# Patient Record
Sex: Female | Born: 1990 | Race: White | Hispanic: No | Marital: Single | Smoking: Never smoker
Health system: Southern US, Community
[De-identification: ages and names within clinical notes are randomized; demographics above are authoritative.]

## PROBLEM LIST (undated history)

## (undated) DIAGNOSIS — F419 Anxiety disorder, unspecified: Secondary | ICD-10-CM

---

## 2014-02-12 ENCOUNTER — Ambulatory Visit (INDEPENDENT_AMBULATORY_CARE_PROVIDER_SITE_OTHER): Payer: BC Managed Care – PPO | Admitting: Family Medicine

## 2014-02-12 VITALS — BP 120/80 | HR 100 | Temp 98.0°F | Resp 16 | Ht 61.25 in | Wt 128.0 lb

## 2014-02-12 DIAGNOSIS — R0602 Shortness of breath: Secondary | ICD-10-CM

## 2014-02-12 DIAGNOSIS — F411 Generalized anxiety disorder: Secondary | ICD-10-CM

## 2014-02-12 MED ORDER — ALBUTEROL SULFATE HFA 108 (90 BASE) MCG/ACT IN AERS: INHALATION_SPRAY | RESPIRATORY_TRACT | Status: DC

## 2014-02-12 MED ORDER — ALBUTEROL SULFATE (2.5 MG/3ML) 0.083% IN NEBU
2.5000 mg | INHALATION_SOLUTION | Freq: Once | RESPIRATORY_TRACT | Status: AC
  Administered 2014-02-12: 2.5 mg via RESPIRATORY_TRACT

## 2014-02-12 MED ORDER — LORAZEPAM 0.5 MG PO TABS: ORAL_TABLET | ORAL | Status: DC

## 2014-02-12 NOTE — Progress Notes (Signed)
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 She has been having problems feeling like she cannot get a deep breath the last few days. She says she's been under a lot of stress and realizes that may be adding to things. She denies headaches. Has had maybe some mild dizziness, fairly nonspecific. No ear pain says her thoughts been a little bit sore from yawning so much because she is trying to get her breath. She has not been having any chest pains or palpitations, though she has a history of having some irregularities in her heart. She apparently has had tests in the past and everything is come back normal. Her mother was a little anxious about that when she spoke to her on the phone. She has not had any GI or GU complaints. She is currently on her menstrual cycle. 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 She has a boyfriend who is here with her today. She is not sexually involved. She does not have any major outlets of things that she does for just relaxation. She says she is involved in her church, [employer].  Objective: No acute distress. Alert and oriented. A little anxious. TMs normal. Throat clear. Neck supple without nodes. Chest clear. Heart regular without murmurs. Actually she does have a little bit of a sinus arrhythmia and a listen to her heart for a long stretch in there were no ectopics.. Abdomen soft without mass or tenderness.  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 She is anxious, and I think that is probably her main thing, but I cannot fully explain the low peak flow.  The posttreatment peak flow improved to 370. She has a hard time forcing herself to exhale rapidly however. It seems to be more a use of the device than  true bronchospasm. However improved a little bit we will try the bronchodilator and something for anxiety. Return if in all worse

## 2014-02-12 NOTE — Patient Instructions (Signed)
Use the albuterol inhaler about 4 times daily 2 inhalations each time as directed if needed for the sensation of shortness of breath  Take lorazepam 0.5 mg twice daily when needed for anxiety  Return if not improving  Go to the emergency room if acute breathing difficulties.

## 2014-05-16 ENCOUNTER — Encounter: Payer: Self-pay | Admitting: *Deleted

## 2014-05-16 ENCOUNTER — Ambulatory Visit (INDEPENDENT_AMBULATORY_CARE_PROVIDER_SITE_OTHER): Payer: BLUE CROSS/BLUE SHIELD | Admitting: Cardiovascular Disease

## 2014-05-16 VITALS — BP 94/66 | HR 88 | Ht 61.0 in | Wt 131.8 lb

## 2014-05-16 DIAGNOSIS — R9431 Abnormal electrocardiogram [ECG] [EKG]: Secondary | ICD-10-CM

## 2014-05-16 DIAGNOSIS — R079 Chest pain, unspecified: Secondary | ICD-10-CM

## 2014-05-16 NOTE — Patient Instructions (Signed)
Your physician recommends that you continue on your current medications as directed. Please refer to the Current Medication list given to you today.  Your physician recommends that you schedule a follow-up appointment as needed with Dr. Nahser.  

## 2014-05-16 NOTE — Progress Notes (Signed)
Cardiology Office Note   Date:  05/16/2014   ID:  Elaine Acosta, DOB 1990/07/17, MRN [id number]  PCP:  No PCP Per Patient  Cardiologist:   Vesta Mixer, MD   Chief Complaint  Patient presents with  . Shortness of Breath   Problem List 1. Dyspnea 2. Mitral valve prolapse    History of Present Illness: Elaine Acosta is a 24 y.o. female who presents for evaluation of some chest pain and shortness of breath.  She was seen with her mother today.  She had a stress myoview April, 2015 which was negative for ischemia. 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  She works out regularly  3-4 times a week , no CP or dyspnea . Occasionally feel like she cant take a deep breath.    Has been having the breathing troubles for the past year - worse this month Also has dizziness - MRI of the brain was normal   Non smoker  Non ETOH.     No past medical history on file.  No past surgical history on file.   Current Outpatient Prescriptions  Medication Sig Dispense Refill  . albuterol (PROVENTIL HFA;VENTOLIN HFA) 108 (90 BASE) MCG/ACT inhaler Use 2 inhalations 4 times daily as needed for difficulty breathing 1 Inhaler 1  . LORazepam (ATIVAN) 0.5 MG tablet Take one twice daily if needed for anxiety attack or chest tightness sensation 20 tablet 0   No current facility-administered medications for this visit.    Allergies:   Review of patient's allergies indicates no known allergies.    Social History:  The patient  reports that she has never smoked. She does not have any smokeless tobacco history on file.   Family History:  The patient's family history includes Cancer in her paternal grandmother; High Cholesterol in her father.    ROS:  Please see the history of present illness.    Review of Systems: Constitutional:  denies fever, chills, diaphoresis, appetite change and fatigue.    HEENT: denies photophobia, eye pain, redness, hearing loss, ear pain, congestion, sore throat, rhinorrhea, sneezing, neck pain, neck stiffness and tinnitus.  Respiratory: denies SOB, DOE, cough, chest tightness, and wheezing.  Cardiovascular: denies chest pain, palpitations and leg swelling.  Gastrointestinal: denies nausea, vomiting, abdominal pain, diarrhea, constipation, blood in stool.  Genitourinary: denies dysuria, urgency, frequency, hematuria, flank pain and difficulty urinating.  Musculoskeletal: denies  myalgias, back pain, joint swelling, arthralgias and gait problem.   Skin: denies pallor, rash and wound.  Neurological: denies dizziness, seizures, syncope, weakness, light-headedness, numbness and headaches.   Hematological: denies adenopathy, easy bruising, personal or family bleeding history.  Psychiatric/ Behavioral: denies suicidal ideation, mood changes, confusion, nervousness, sleep disturbance and agitation.       All other systems are reviewed and negative.    PHYSICAL EXAM: VS:  There were no vitals taken for this visit. , BMI There is no weight on file to calculate BMI. GEN: Well nourished, well developed, in no acute distress HEENT: normal Neck: no JVD, carotid bruits, or masses Cardiac: RRR; no significant  Murmur, no  rubs, or gallops,no edema  Respiratory:  clear to auscultation bilaterally, normal work of breathing GI: soft, nontender, nondistended, + BS MS: no deformity or atrophy Skin: warm and dry, no rash Neuro:  Strength and sensation are intact Psych: normal   EKG:  EKG is ordered today. The ekg ordered  today demonstrates NSR at 66. No ST or T wave changes. [place] from [place] revealed TWI in lead V3.    Recent Labs: No results found for requested labs within last 365 days.    Lipid Panel No results found for: CHOL, TRIG, HDL, CHOLHDL, VLDL, LDLCALC, LDLDIRECT    Wt Readings from Last 3 Encounters:  02/12/14 128 lb (58.06 kg)      Other  studies Reviewed: Additional studies/ records that were reviewed today include: . Review of the above records demonstrates:    ASSESSMENT AND PLAN:  1.  Chest pain / dyspnea:  She has had a stress myoview and an echo which were both normal . She had mild myxomatous changes of the mitral valve.  She exercises 3-4 times a week and has never had any problems with her work outs .  She had an abnormal ECG in [place] but her repeat ECG here is normal .   I do not think that she needs any repeat testing.  She has my card and can call me if she has any further problems.     Current medicines are reviewed at length with the patient today.  The patient does not have concerns regarding medicines.  The following changes have been made:  no change   Disposition:   FU with me as needed.     Signed, Zephaniah Enyeart, Deloris PingPhilip J, MD  05/16/2014 6:19 AM    [facility] [address], [place], [place]  [address] Phone: [phone]) [phone]; Fax: [phone]

## 2014-12-31 ENCOUNTER — Ambulatory Visit (INDEPENDENT_AMBULATORY_CARE_PROVIDER_SITE_OTHER): Payer: BLUE CROSS/BLUE SHIELD | Admitting: Physician Assistant

## 2014-12-31 VITALS — BP 122/78 | HR 72 | Temp 97.8°F | Resp 18 | Ht 62.0 in | Wt 136.8 lb

## 2014-12-31 DIAGNOSIS — R0981 Nasal congestion: Secondary | ICD-10-CM

## 2014-12-31 MED ORDER — GUAIFENESIN ER 1200 MG PO TB12: 1.0000 | ORAL_TABLET | Freq: Two times a day (BID) | ORAL | Status: AC

## 2014-12-31 MED ORDER — FLUTICASONE PROPIONATE 50 MCG/ACT NA SUSP: 2.0000 | Freq: Every day | NASAL | Status: DC

## 2014-12-31 NOTE — Progress Notes (Signed)
Elaine Acosta  MRN: [id number] DOB: [birth year]  Subjective:  Pt presents to clinic with about a week of feeling no appetite and SOB.  This is like what she had last year when she was seen in 02/2014.  Last year she was given albuterol and that did not help her SOB rather she felt like it might have made with worse.  The SOB is a sensation that she cannot get a full deep breath but she has had no wheezing and no trouble talking in full sentences.  She has not really eaten in the last week because she states that she is not hungry.  She has been drinking a lot of water - she normally does this but she is drinking more than normal over the last week.  She has had some PND and 2 days ago she had some nasal rhinorrhea but that resolved but not the PND.    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  Used ativan yesterday that she had from last year and that seemed to calm her down and slightly help her breathing.  There are no active problems to display for this patient.   No current outpatient prescriptions on file prior to visit.   No current facility-administered medications on file prior to visit.    No Known Allergies  Review of Systems  Constitutional: Positive for chills, appetite change (decreased) and fatigue. Negative for fever.  HENT: Positive for congestion (intermittent - clear about 3 days ago), ear pain and postnasal drip. Negative for sore throat.   Respiratory: Positive for apnea and shortness of breath. Negative for wheezing.        No h/o asthma, nonsmoker  Gastrointestinal: Negative for nausea, vomiting, abdominal pain, diarrhea and constipation.  Musculoskeletal: Positive for myalgias.  Allergic/Immunologic: Negative for environmental allergies.  Neurological: Negative for  dizziness and headaches.   Objective:  BP 122/78 mmHg  Pulse 72  Temp(Src) 97.8 F (36.6 C) (Oral)  Resp 18  Ht  (1.575 m)  Wt 136 lb 12.8 oz (62.052 kg)  BMI 25.01 kg/m2  SpO2 96%  LMP 12/24/2014  Physical Exam  Constitutional: She is oriented to person, place, and time and well-developed, well-nourished, and in no distress.  Pt drinks a lot of water during her visit - seems to be using it for breaks  HENT:  Head: Normocephalic and atraumatic.  Right Ear: Hearing, tympanic membrane, external ear and ear canal normal.  Left Ear: Hearing, tympanic membrane, external ear and ear canal normal.  Nose: Mucosal edema (red and swollen) present.  Mouth/Throat: Uvula is midline, oropharynx is clear and moist and mucous membranes are normal.  Eyes: Conjunctivae are normal.  Neck: Normal range of motion.  Cardiovascular: Normal rate, regular rhythm and normal heart sounds.   No murmur heard. Pulmonary/Chest: Effort normal and breath sounds normal. She has no wheezes.  Neurological: She is alert and oriented to person, place, and time. Gait normal.  Skin: Skin is warm and dry.  Psychiatric: Mood, memory, affect and judgment normal.  Vitals reviewed.  Orthostatic VS for the past 24 hrs:  BP- Lying Pulse- Lying BP- Sitting Pulse- Sitting BP- Standing at 0 minutes Pulse- Standing at 0 minutes  12/31/14 0904 113/74 mmHg 66 104/69 mmHg 76  112/76 mmHg 88    Assessment and Plan :  Nasal congestion - Plan: Guaifenesin (MUCINEX MAXIMUM STRENGTH) 1200 MG TB12, fluticasone (FLONASE) 50 MCG/ACT nasal spray   No abnormal lung exam - pts breathing appears to be fine in the room - she uses water as a diversion.  We discussed anxiety and its possible role in her symptoms.  We will treat her congestion because I think that has a role in her current symptoms.  We will start eating small meals multiple times a day.  She will use the ativan that she has at home which has helped with her symptoms.  She  knows when to RTC - if this continues she will RTC for CBC and TSH.  Benny Lennert PA-C  [facility] 12/31/2014 9:24 AM

## 2015-01-27 ENCOUNTER — Ambulatory Visit (INDEPENDENT_AMBULATORY_CARE_PROVIDER_SITE_OTHER): Payer: BLUE CROSS/BLUE SHIELD | Admitting: Physician Assistant

## 2015-01-27 VITALS — BP 104/78 | HR 99 | Temp 98.2°F | Resp 16 | Ht 60.5 in | Wt 132.5 lb

## 2015-01-27 DIAGNOSIS — R0602 Shortness of breath: Secondary | ICD-10-CM

## 2015-01-27 LAB — POCT CBC
GRANULOCYTE PERCENT: 66.9 % (ref 37–80)
HEMATOCRIT: 40.8 % (ref 37.7–47.9)
HEMOGLOBIN: 13.8 g/dL (ref 12.2–16.2)
LYMPH, POC: 2.5 (ref 0.6–3.4)
MCH, POC: 29 pg (ref 27–31.2)
MCHC: 33.8 g/dL (ref 31.8–35.4)
MCV: 85.7 fL (ref 80–97)
MID (cbc): 0.6 (ref 0–0.9)
MPV: 8.1 fL (ref 0–99.8)
POC GRANULOCYTE: 6.3 (ref 2–6.9)
POC LYMPH %: 26.7 % (ref 10–50)
POC MID %: 6.4 % (ref 0–12)
Platelet Count, POC: 350 10*3/uL (ref 142–424)
RBC: 4.76 M/uL (ref 4.04–5.48)
RDW, POC: 11.8 %
WBC: 9.4 10*3/uL (ref 4.6–10.2)

## 2015-01-27 MED ORDER — MOMETASONE FURO-FORMOTEROL FUM 100-5 MCG/ACT IN AERO
2.0000 | INHALATION_SPRAY | Freq: Two times a day (BID) | RESPIRATORY_TRACT | Status: DC
Start: 2015-01-27 — End: 2015-03-13

## 2015-01-27 NOTE — Progress Notes (Signed)
Elaine Acosta  MRN: [id number] DOB: 1990/05/03  Subjective:  Pt presents to clinic with continued sensation of SOB and the inability to get a full deep breath without wheezing.  Her nasal congestion has gotten much better and almost resolved but a few days ago she started to get a little chest congestion but it did not seem to affect her SOB.  She does not have a cough.  She does not have symptoms of heartburn and the SOB does not seem to be after she eats or when she lays down.  She is still able to sing but she notices that she is winded afterwards and it takes her longer than normal to recover.  She also notices that walking up steps leaves her more winded.  She has not used her albuterol because the SE are worse.  She has used her Ativan a few times and she thinks that it may help but she is not sure.  She knows she has anxiety but she is really unaware when that is affecting her.  She has no associated symptoms such as palpitation or sweating or chest pain.  No SOB with sleeping or laying down.  There are no active problems to display for this patient.   Current Outpatient Prescriptions on File Prior to Visit  Medication Sig Dispense Refill  . fluticasone (FLONASE) 50 MCG/ACT nasal spray Place 2 sprays into both nostrils daily. 16 g 0   No current facility-administered medications on file prior to visit.    No Known Allergies  Review of Systems  Constitutional: Positive for appetite change (decrease). Negative for fever and chills.  HENT: Negative for congestion and postnasal drip.   Respiratory: Positive for cough and shortness of breath (esp with activity or singing but will get it other times also - feels like she cannot get a full deep  breath vs  being short of breath). Negative for wheezing.   Cardiovascular:       No heartburn or indigestion  Gastrointestinal: Negative for nausea and abdominal pain.  Psychiatric/Behavioral: Negative for sleep disturbance.   Objective:    BP 104/78 mmHg  Pulse 99  Temp(Src) 98.2 F (36.8 C) (Oral)  Resp 16  Ht 5' 0.5" (1.537 m)  Wt 132 lb 8 oz (60.102 kg)  BMI 25.44 kg/m2  SpO2 98%  LMP 01/22/2015  Physical Exam  Constitutional: She is oriented to person, place, and time and well-developed, well-nourished, and in no distress.  HENT:  Head: Normocephalic and atraumatic.  Right Ear: Hearing, tympanic membrane, external ear and ear canal normal.  Left Ear: Hearing, tympanic membrane, external ear and ear canal normal.  Nose: Nose normal.  Mouth/Throat: Uvula is midline, oropharynx is clear and moist and mucous membranes are normal.  Eyes: Conjunctivae are normal.  Neck: Normal range of motion.  Cardiovascular: Normal rate, regular rhythm and normal heart sounds.   No murmur heard. Pulmonary/Chest: Effort normal and breath sounds normal. She has no wheezes.  Neurological: She is alert and oriented to person, place, and time. Gait normal.  Skin: Skin is warm and dry.  Psychiatric: Mood, memory, affect and judgment normal.  Vitals reviewed. . Attempted spirometry but pat was not able to get 3 consistent readings - she stated that she was unable to breath for as long as was needed - she did not feel like she could complete the test.  Results for orders placed or performed in visit on 01/27/15  TSH  Result Value Ref Range  TSH 3.266 0.350 - 4.500 uIU/mL  COMPLETE METABOLIC PANEL WITH GFR  Result Value Ref Range   Sodium 139 135 - 146 mmol/L   Potassium 4.3 3.5 - 5.3 mmol/L   Chloride 104 98 - 110 mmol/L   CO2 26 20 - 31 mmol/L   Glucose, Bld 84 65 - 99 mg/dL   BUN 11 7 - 25 mg/dL   Creat 1.470.70 8.290.50 - 5.621.10 mg/dL   Total Bilirubin 1.5 (H) 0.2 - 1.2 mg/dL   Alkaline Phosphatase 56 33 - 115 U/L   AST 14 10 - 30 U/L   ALT 6 6 - 29 U/L   Total Protein 7.2 6.1 - 8.1 g/dL   Albumin 4.4 3.6 - 5.1 g/dL   Calcium 9.9 8.6 - 13.010.2 mg/dL   GFR, Est African American >89 >=60 mL/min   GFR, Est Non African American >89  >=60 mL/min  POCT CBC  Result Value Ref Range   WBC 9.4 4.6 - 10.2 K/uL   Lymph, poc 2.5 0.6 - 3.4   POC LYMPH PERCENT 26.7 10 - 50 %L   MID (cbc) 0.6 0 - 0.9   POC MID % 6.4 0 - 12 %M   POC Granulocyte 6.3 2 - 6.9   Granulocyte percent 66.9 37 - 80 %G   RBC 4.76 4.04 - 5.48 M/uL   Hemoglobin 13.8 12.2 - 16.2 g/dL   HCT, POC 86.540.8 78.437.7 - 47.9 %   MCV 85.7 80 - 97 fL   MCH, POC 29.0 27 - 31.2 pg   MCHC 33.8 31.8 - 35.4 g/dL   RDW, POC 69.611.8 %   Platelet Count, POC 350 142 - 424 K/uL   MPV 8.1 0 - 99.8 fL   Assessment and Plan :  SOB (shortness of breath) - Plan: TSH, COMPLETE METABOLIC PANEL WITH GFR, POCT CBC, mometasone-formoterol (DULERA) 100-5 MCG/ACT AERO   Pt does not appear SOB in the office - she is able to speak in full sentences.  She seems less anxious this visit and drinks her water less.  Laryngeal reflux or reactive airway disease is likely - we will treat the possible inflammation in her lungs with an inhaled steroid over the next 2 weeks to see if her symptoms improved.  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  If she is no better in 2 weeks she will start Zantac to treat possible reflux.  We did talk about anxiety possibility and she is going to seek school counseling to help her figure out her anxiety.  She will recheck with me in a month.  D/w Dr Mckinley Jeweloolittle  Sarah Weber PA-C  [facility] 01/29/2015 11:57 AM

## 2015-01-28 LAB — COMPLETE METABOLIC PANEL WITH GFR
ALBUMIN: 4.4 g/dL (ref 3.6–5.1)
ALK PHOS: 56 U/L (ref 33–115)
ALT: 6 U/L (ref 6–29)
AST: 14 U/L (ref 10–30)
BUN: 11 mg/dL (ref 7–25)
CALCIUM: 9.9 mg/dL (ref 8.6–10.2)
CO2: 26 mmol/L (ref 20–31)
CREATININE: 0.7 mg/dL (ref 0.50–1.10)
Chloride: 104 mmol/L (ref 98–110)
GFR, Est Non African American: >89 mL/min (ref >=60)
Glucose, Bld: 84 mg/dL (ref 65–99)
Potassium: 4.3 mmol/L (ref 3.5–5.3)
Sodium: 139 mmol/L (ref 135–146)
Total Bilirubin: 1.5 mg/dL — ABNORMAL HIGH (ref 0.2–1.2)
Total Protein: 7.2 g/dL (ref 6.1–8.1)

## 2015-01-28 LAB — TSH: TSH: 3.266 u[IU]/mL (ref 0.350–4.500)

## 2015-02-18 ENCOUNTER — Encounter: Payer: BLUE CROSS/BLUE SHIELD | Admitting: Physician Assistant

## 2015-02-18 ENCOUNTER — Encounter: Payer: Self-pay | Admitting: Physician Assistant

## 2015-02-20 ENCOUNTER — Ambulatory Visit (INDEPENDENT_AMBULATORY_CARE_PROVIDER_SITE_OTHER): Payer: BLUE CROSS/BLUE SHIELD | Admitting: Physician Assistant

## 2015-02-20 ENCOUNTER — Encounter: Payer: Self-pay | Admitting: Physician Assistant

## 2015-02-20 VITALS — BP 109/73 | HR 86 | Temp 98.2°F | Resp 16 | Ht 60.5 in | Wt 132.0 lb

## 2015-02-20 DIAGNOSIS — F411 Generalized anxiety disorder: Secondary | ICD-10-CM

## 2015-02-20 DIAGNOSIS — R0602 Shortness of breath: Secondary | ICD-10-CM | POA: Diagnosis not present

## 2015-02-20 NOTE — Patient Instructions (Signed)
We did a referral to pulmonology to make sure their is nothing wrong with your lungs.  Make an appointment with Dr Melburn PopperNasher

## 2015-02-20 NOTE — Progress Notes (Signed)
Elaine Acosta  MRN: [id number] DOB: 1990-11-18  Subjective:  Pt presents to clinic for recheck.   Did not feel like she got any relief from the [facility] - she has been on it for a month and has noticed no difference.  When she is not anxious she does not have the SOB and she does not worry about her lungs but when she is anxious she worries about her lungs.  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  She gets no relief from the albuterol.  She tried Zantac for several days to see if this could possible be heartburn but she did not notice a difference.  She gets no relief from use of albuterol inhaler with her current symptoms.  She would like to see a pulmonologist to make sure her lungs are clear.  She also feels like she needs to see the cardiologist because she is starting to worry about her heart again.  She had some ABNL EKGs in [place] but saw Dr Melburn Popper 05/2014 and was told she was normal.    She has lost weight due to lack of appetite from stress.    There are no active problems to display for this patient.   Current Outpatient Prescriptions on File Prior to Visit  Medication Sig Dispense Refill  . fluticasone (FLONASE) 50 MCG/ACT nasal spray Place 2 sprays into both nostrils daily. 16 g 0  . LORazepam (ATIVAN) 0.5 MG tablet Take 0.25 mg by mouth as needed for anxiety.    . mometasone-formoterol (DULERA) 100-5 MCG/ACT AERO Inhale 2 puffs into the lungs 2 (two) times daily. 1 Inhaler 0   No current facility-administered medications on file prior to visit.    No Known Allergies  Review of Systems  HENT: Negative for congestion.   Respiratory: Positive for shortness of breath. Negative for cough and wheezing.   Cardiovascular: Negative for palpitations.  Psychiatric/Behavioral: The patient is nervous/anxious.    Objective:  BP 109/73 mmHg  Pulse  86  Temp(Src) 98.2 F (36.8 C)  Resp 16  Ht 5' 0.5" (1.537 m)  Wt 132 lb (59.875 kg)  BMI 25.35 kg/m2  SpO2 100%  LMP 02/17/2015  Physical Exam  Constitutional: She is oriented to person, place, and time and well-developed, well-nourished, and in no distress.  HENT:  Head: Normocephalic and atraumatic.  Right Ear: Hearing and external ear normal.  Left Ear: Hearing and external ear normal.  Eyes: Conjunctivae are normal.  Neck: Normal range of motion.  Cardiovascular: Normal rate, regular rhythm and normal heart sounds.   No murmur heard. Pulmonary/Chest: Effort normal and breath sounds normal. She has no wheezes.  Neurological: She is alert and oriented to person, place, and time. Gait normal.  Skin: Skin is warm and dry.  Psychiatric: Mood, memory, affect and judgment normal.  Vitals reviewed.   Assessment and Plan :  SOB (shortness of breath) - Plan: Ambulatory referral to Pulmonology  Anxiety state   Pt needs reassurance that her lungs are normal function.  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  She will stop all her medications while waiting to see pulmonology.  She also plans to make a cards appointment to get reassurance that her heart is also ok.  She will continue with the counseling that she started because she thinks that is going to be helpful.  Benny LennertSarah Weber PA-C  [facility] 02/20/2015 5:33 PM

## 2015-02-21 NOTE — Progress Notes (Signed)
This encounter was created in error - please disregard.  This encounter was created in error - please disregard.

## 2015-02-25 ENCOUNTER — Emergency Department (HOSPITAL_COMMUNITY)
Admission: EM | Admit: 2015-02-25 | Discharge: 2015-02-25 | Disposition: A | Discharge status: Home / Self Care | Payer: BLUE CROSS/BLUE SHIELD | Attending: Emergency Medicine | Admitting: Emergency Medicine

## 2015-02-25 ENCOUNTER — Emergency Department (HOSPITAL_COMMUNITY): Payer: BLUE CROSS/BLUE SHIELD

## 2015-02-25 ENCOUNTER — Encounter (HOSPITAL_COMMUNITY): Payer: Self-pay | Admitting: Emergency Medicine

## 2015-02-25 DIAGNOSIS — R0600 Dyspnea, unspecified: Secondary | ICD-10-CM | POA: Diagnosis not present

## 2015-02-25 DIAGNOSIS — R0602 Shortness of breath: Secondary | ICD-10-CM | POA: Diagnosis present

## 2015-02-25 DIAGNOSIS — F419 Anxiety disorder, unspecified: Secondary | ICD-10-CM | POA: Insufficient documentation

## 2015-02-25 DIAGNOSIS — Z7951 Long term (current) use of inhaled steroids: Secondary | ICD-10-CM | POA: Insufficient documentation

## 2015-02-25 HISTORY — DX: Anxiety disorder, unspecified: F41.9

## 2015-02-25 LAB — CBC WITH DIFFERENTIAL/PLATELET
Basophils Absolute: 0 10*3/uL (ref 0.0–0.1)
Basophils Relative: 0 %
Eosinophils Absolute: 0 10*3/uL (ref 0.0–0.7)
Eosinophils Relative: 0 %
HCT: 41.7 % (ref 36.0–46.0)
Hemoglobin: 14.4 g/dL (ref 12.0–15.0)
Lymphocytes Relative: 16 %
Lymphs Abs: 1.6 10*3/uL (ref 0.7–4.0)
MCH: 29.7 pg (ref 26.0–34.0)
MCHC: 34.5 g/dL (ref 30.0–36.0)
MCV: 86 fL (ref 78.0–100.0)
Monocytes Absolute: 0.6 10*3/uL (ref 0.1–1.0)
Monocytes Relative: 6 %
Neutro Abs: 7.5 10*3/uL (ref 1.7–7.7)
Neutrophils Relative %: 78 %
Platelets: 320 10*3/uL (ref 150–400)
RBC: 4.85 MIL/uL (ref 3.87–5.11)
RDW: 11.8 % (ref 11.5–15.5)
WBC: 9.7 10*3/uL (ref 4.0–10.5)

## 2015-02-25 LAB — BASIC METABOLIC PANEL
Anion gap: 10 (ref 5–15)
BUN: 11 mg/dL (ref 6–20)
CO2: 26 mmol/L (ref 22–32)
Calcium: 9.4 mg/dL (ref 8.9–10.3)
Chloride: 103 mmol/L (ref 101–111)
Creatinine, Ser: 0.62 mg/dL (ref 0.44–1.00)
GFR calc Af Amer: >60 mL/min (ref >60)
GFR calc non Af Amer: >60 mL/min (ref >60)
Glucose, Bld: 103 mg/dL — ABNORMAL HIGH (ref 65–99)
Potassium: 3.5 mmol/L (ref 3.5–5.1)
Sodium: 139 mmol/L (ref 135–145)

## 2015-02-25 LAB — D-DIMER, QUANTITATIVE: D-Dimer, Quant: 0.33 ug/mL-FEU (ref 0.00–0.50)

## 2015-02-25 LAB — I-STAT BETA HCG BLOOD, ED (MC, WL, AP ONLY): I-stat hCG, quantitative: <5 m[IU]/mL (ref <5)

## 2015-02-25 NOTE — ED Notes (Signed)
Pt states that for the past 2 months, she has felt SOB.  States that she has an appt with the pulmonologist in January but didn't want to wait that long.  States that nothing happened that caused her to come to the emergency room but just stated that whatever she was going to have done in January at the pulmonologist, she wants done today.  It was explained to the patient that this is not the purpose of the emergency department and it is our duty to make sure that she is not in life threatening danger and then she will likely still be referred to follow up with her pulmonologist in January.  Pt asking if we could at least "do some of the stuff they are going to do at the pulmonologist so she does not have to wait.  Pt states that she has been seen extensively for this and describes having multiple EKGs, lung studies, echos, etc.  Pt reports hx of anxiety.  Pt in NAD at this time.  No pain.

## 2015-02-25 NOTE — ED Provider Notes (Signed)
CSN: [id number]     Arrival date & time 02/25/15  1628 History   First MD Initiated Contact with Patient 02/25/15 1647     Chief Complaint  Patient presents with  . Shortness of Breath     (Consider location/radiation/quality/duration/timing/severity/associated sxs/prior Treatment) HPI   24 year old female with dyspnea. This ongoing for the past 2 months. She has been evaluated for the same in excess upcoming appointment with a pulmonologist. Patient is anxious to wait this long. She wants me just today. She feel short of breath with certain activities but also symptoms short of breath while at rest. Denies any other associated symptoms such as chest pain, cough, leg swelling, fevers, night sweats, etc. Nonsmoker. Music major. No OCPs.   Past Medical History  Diagnosis Date  . Anxiety    History reviewed. No pertinent past surgical history. History reviewed. No pertinent family history. Social History  Substance Use Topics  . Smoking status: Never Smoker   . Smokeless tobacco: Never Used  . Alcohol Use: 0.0 oz/week    0 Standard drinks or equivalent per week     Comment: Rarely   OB History    No data available     Review of Systems  All systems reviewed and negative, other than as noted in HPI.   Allergies  Review of patient's allergies indicates no known allergies.  Home Medications   Prior to Admission medications   Medication Sig Start Date End Date Taking? Authorizing Provider  fluticasone (FLONASE) 50 MCG/ACT nasal spray Place 2 sprays into both nostrils daily. 12/31/14   Morrell RiddleSarah L Weber, PA-C  LORazepam (ATIVAN) 0.5 MG tablet Take 0.25 mg by mouth as needed for anxiety.    Historical Provider, MD  mometasone-formoterol (DULERA) 100-5 MCG/ACT AERO Inhale 2 puffs into the lungs 2 (two) times daily. 01/27/15   Morrell RiddleSarah L Weber, PA-C   BP 129/87 mmHg  Pulse 90  Temp(Src) 98.7 F (37.1 C) (Oral)  Resp 18  SpO2 100%  LMP 02/17/2015 Physical Exam  Constitutional:  She appears well-developed and well-nourished. No distress.  HENT:  Head: Normocephalic and atraumatic.  Eyes: Conjunctivae are normal. Right eye exhibits no discharge. Left eye exhibits no discharge.  Neck: Neck supple.  Cardiovascular: Normal rate, regular rhythm and normal heart sounds.  Exam reveals no gallop and no friction rub.   No murmur heard. Pulmonary/Chest: Effort normal and breath sounds normal. No respiratory distress.  Speaks in complete sentences. No accessory muscle usage.  Abdominal: Soft. She exhibits no distension. There is no tenderness.  Musculoskeletal: She exhibits no edema or tenderness.  Lower extremities symmetric as compared to each other. No calf tenderness. Negative Homan's. No palpable cords.   Neurological: She is alert.  Skin: Skin is warm and dry.  Psychiatric: She has a normal mood and affect. Her behavior is normal. Thought content normal.  Nursing note and vitals reviewed.   ED Course  Procedures (including critical care time) Labs Review Labs Reviewed - No data to display  Imaging Review No results found.   Dg Chest 2 View  02/25/2015  CLINICAL DATA:  Acute shortness of breath for 2 months. History of anxiety. EXAM: CHEST  2 VIEW COMPARISON:  None available FINDINGS: The heart size and mediastinal contours are within normal limits. Both lungs are clear. The visualized skeletal structures are unremarkable. IMPRESSION: No active cardiopulmonary disease. Electronically Signed   By: Judie PetitM.  Shick M.D.   On: 02/25/2015 18:01   I have personally reviewed and evaluated these  images and lab results as part of my medical decision-making.   EKG Interpretation   Date/Time:  Tuesday February 25 2015 16:38:46 EST Ventricular Rate:  111 PR Interval:  137 QRS Duration: 96 QT Interval:  330 QTC Calculation: 448 R Axis:   62 Text Interpretation:  Sinus tachycardia Nonspecific T abnormalities,  inferior leads Confirmed by Juleen China  MD, Ritchard Paragas ([id number]) on  02/25/2015  5:14:19 PM      MDM   Final diagnoses:  Dyspnea    24 year old female with continued dyspnea. Objectively, she looks very well. She has no increased work of breathing. Her oxygen saturations are normal. She has an appointment with a pulmonologist but she is impatient to wait this long. Very low suspicion for emergent etiology of her complaints. Workup was unremarkable. Chest x-ray is clear. She has a normal d-dimer. There may potentially be an anxiety component to this. Regardless, I feel she is appropriate for further evaluation by pulmonology.  It has been determined that no acute conditions requiring further emergency intervention are present at this time. The patient has been advised of the diagnosis and plan. I reviewed any labs and imaging including any potential incidental findings. We have discussed signs and symptoms that warrant return to the ED and they are listed in the discharge instructions.      Raeford Razor, MD 03/13/15 [phone]

## 2015-02-25 NOTE — Progress Notes (Signed)
Pt states she is from [place] and her permanent pcp is in [place]  Pt reports seeing Ricka BurdockSarah Webber at [facility] [place]  EPIC updated and pt assisted to bathroom

## 2015-02-25 NOTE — Discharge Instructions (Signed)

## 2015-02-25 NOTE — ED Notes (Signed)
Patient transported to X-ray 

## 2015-03-13 ENCOUNTER — Ambulatory Visit (INDEPENDENT_AMBULATORY_CARE_PROVIDER_SITE_OTHER): Payer: BLUE CROSS/BLUE SHIELD | Admitting: Internal Medicine

## 2015-03-13 ENCOUNTER — Encounter: Payer: Self-pay | Admitting: Internal Medicine

## 2015-03-13 VITALS — BP 112/70 | HR 65 | Ht 61.0 in | Wt 130.4 lb

## 2015-03-13 DIAGNOSIS — R0600 Dyspnea, unspecified: Secondary | ICD-10-CM | POA: Diagnosis not present

## 2015-03-13 LAB — NITRIC OXIDE: Nitric Oxide: 7

## 2015-03-13 NOTE — Assessment & Plan Note (Addendum)
Onset Aug 2016 - spirometry 03/13/2015 wnl though f/v a bit erratic  - NO 03/13/2015 = 7  Which is strongly against any form of allergic asthma  - 03/13/2015  Walked RA x 3 laps @ 185 ft each stopped due to  End of study, nl pace, no sob or desat    She took several very deep breaths that show me she's breathing to very top of her lung capacity similar to what we see in patients with emphysema. The difference is that it only happens for 1 or 2 breaths and then it resolves. This is typical of hyperventilation to a point of hyperinflation and is typically seen in patients with anxiety disorders(which she tells me runs strongly in her fm)  and globus hystericus which I believe explains her throat symptoms as well. However, it is possible she has unrelated reflux-induced laryngeal spasm and is reasonable to first try diet and then add a 4 week trial of acid suppression to see if this makes any difference.  Total time devoted to counseling  = 1263m review case with pt/ discussion of options/alternatives/ giving and going over instructions (see avs)      The diagnoses remains about the next step would be a CP ST after the holidays.  Total time devoted to counseling  = 35/6161m review case with pt/ discussion of options/alternatives/ giving and going over instructions (see avs)

## 2015-03-13 NOTE — Patient Instructions (Addendum)
GERD (REFLUX)  is an extremely common cause of respiratory symptoms just like yours , many times with no obvious heartburn at all.    It can be treated with medication, but also with lifestyle changes including elevation of the head of your bed (ideally with 6 inch  bed blocks),  Smoking cessation, avoidance of late meals, excessive alcohol, and avoid fatty foods, chocolate, peppermint, colas, red wine, and acidic juices such as orange juice.  NO MINT OR MENTHOL PRODUCTS SO NO COUGH DROPS  USE SUGARLESS CANDY INSTEAD (Jolley ranchers or Stover's or Life Savers) or even ice chips will also do - the key is to swallow to prevent all throat clearing. NO OIL BASED VITAMINS - use powdered substitutes.    If not better add Try prilosec otc 20mg   Take 30-60 min before first meal of the day and Pepcid ac (famotidine) 20 mg one @  bedtime  X one month minimum  To get the most out of exercise, you need to be continuously aware that you are short of breath, but never out of breath, for 30 minutes daily. As you improve, it will actually be easier for you to do the same amount of exercise  in  30 minutes so always push to the level where you are short of breath.    If not better after the first of the year return here for additional studies (cpst with spirometry before and after)

## 2015-03-13 NOTE — Progress Notes (Signed)
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  03/13/2015 1st [facility] office visit/ [place]   Chief Complaint  Patient presents with  . Pulmonary Consult    Referred by Dr. Benny LennertSarah Weber. Pt c/o SOB for the past 3 months. She states that if she walks up an incline or rides her bike to class she gets out of breath. She sometimes feels that she is unable to take in a good, deep breath. She states "I have a constant frog in my throat"- occ has non prod cough.   acute onset for the first time while trying [place] swim [place] waves [place] the beach and ever since then attacks of sob which last one or two breaths most commonly at rest but also with ex assoc with throat tightness / urge to clear throat but has not affected her ability to sing or voice texture.  No better with saba  No obvious other patterns in day to day or daytime variabilty or assoc  excess or purulent sputum or mucous plugs or cp or chest tightness, subjective wheeze overt sinus or hb symptoms. No unusual exp hx or h/o childhood pna/ asthma or knowledge of premature birth.  Sleeping ok without nocturnal  or early am exacerbation  of respiratory  c/o's or need for noct saba. Also denies any obvious fluctuation of symptoms with weather or environmental changes or other aggravating or alleviating factors except as outlined above   Current Medications, Allergies, Complete Past Medical History, Past Surgical History, Family History, and Social History were reviewed in [facility] electronic medical record.          Review of Systems  Constitutional: Negative for fever, chills and unexpected weight change.  HENT: Negative for congestion, dental problem, ear pain, nosebleeds, postnasal drip, rhinorrhea, sinus pressure, sneezing,  sore throat, trouble swallowing and voice change.   Eyes: Negative for visual disturbance.  Respiratory: Positive for cough and shortness of breath. Negative for choking.   Cardiovascular: Negative for chest pain and leg swelling.  Gastrointestinal: Negative for vomiting, abdominal pain and diarrhea.  Genitourinary: Negative for difficulty urinating.  Musculoskeletal: Negative for arthralgias.  Skin: Negative for rash.  Neurological: Negative for tremors, syncope and headaches.  Hematological: Does not bruise/bleed easily.       Objective:   Physical Exam   amb slt anxious wf nad with occ sigh breaths at max lung vol  Wt Readings from Last 3 Encounters:  03/13/15 130 lb 6.4 oz (59.149 kg)  02/20/15 132 lb (59.875 kg)  02/18/15 131 lb (59.421 kg)    Vital signs reviewed  HEENT: nl dentition, turbinates, and oropharynx. Nl external ear canals without cough reflex   NECK :  without JVD/Nodes/TM/ nl carotid upstrokes bilaterally   LUNGS: no acc muscle use,  Nl contour chest which is clear to A and P bilaterally without cough on insp or exp maneuvers   CV:  RRR  no s3 or murmur or increase in P2, no edema   ABD:  soft and nontender with nl inspiratory excursion in the supine position. No bruits or organomegaly, bowel sounds nl  MS:  Nl gait/ ext warm without deformities, calf tenderness, cyanosis or clubbing No obvious joint restrictions   SKIN: warm and dry  without lesions    NEURO:  alert, approp, nl sensorium with  no motor deficits       I personally reviewed images and agree with radiology impression as follows:  CXR:  02/25/15 No active cardiopulmonary disease.     Assessment & Plan:

## 2015-04-09 ENCOUNTER — Institutional Professional Consult (permissible substitution): Payer: BLUE CROSS/BLUE SHIELD | Admitting: Internal Medicine

## 2015-06-06 ENCOUNTER — Ambulatory Visit (INDEPENDENT_AMBULATORY_CARE_PROVIDER_SITE_OTHER): Payer: BLUE CROSS/BLUE SHIELD | Admitting: Acute Care

## 2015-06-06 ENCOUNTER — Encounter: Payer: Self-pay | Admitting: Acute Care

## 2015-06-06 VITALS — BP 108/68 | HR 90 | Ht 61.0 in | Wt 134.6 lb

## 2015-06-06 DIAGNOSIS — R0600 Dyspnea, unspecified: Secondary | ICD-10-CM

## 2015-06-06 NOTE — Progress Notes (Signed)
Subjective:    Patient ID: Elaine Acosta, female    DOB: 01/01/1991, 25 y.o.   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  Significant events/procedures:  03/13/2015 : Spirometry within normal limits 03/13/2015: Nitrous oxide = 7  02/25/2015 : Chest x-ray 2 view:  FINDINGS: The heart size and mediastinal contours are within normal limits. Both lungs are clear. The visualized skeletal structures are unremarkable.  IMPRESSION: No active cardiopulmonary disease.  06/06/2015: Follow up office visit:  Elaine Acosta presents to the office today. She has had a trial of anti-reflux medications, Prilosec in the morning and Pepcid in the evenings which she stated did help  her shortness of breath. Her compliance with this regimen per her own history is inconsistent.She is uncomfortable taking Prilosec long-term. She is otherwise healthy, recognizing that there is a possible element of anxiety and hyperventilation contributing to her sensation of shortness of breath while singing. She did note that while at home in [place] in December she did not have the sensation or shortness of breath. Per Dr. Thurston Hole note the next diagnostic step  would be a cardiac pulmonary stress test. When I discussed this with Elaine Acosta she stated she had had that done approximately 2 years ago in [place]. She self reports that it was normal. I discussed this with Dr. Sherene Sires, who felt the next most appropriate step is a methacholine asthma challenge test, which we will schedule. If This is also normal, we will consider allergy testing, as when she was in [place] at Christmas she appeared to be well. There is the possibility that  regional allergies may be a contributing factor in her symptoms.   Current outpatient prescriptions:  .  albuterol (PROVENTIL HFA;VENTOLIN HFA) 108 (90 BASE)  MCG/ACT inhaler, Inhale 1 puff into the lungs every 6 (six) hours as needed for wheezing or shortness of breath., Disp: , Rfl:  .  LORazepam (ATIVAN) 0.5 MG tablet, Take 0.25 mg by mouth as needed for anxiety., Disp: , Rfl:    Past Medical History  Diagnosis Date  . Anxiety   No Known Allergies  Review of Systems Constitutional:   No  weight loss, night sweats,  Fevers, chills, fatigue, or  lassitude.  HEENT:   No headaches,  Difficulty swallowing,  Tooth/dental problems, or  Sore throat,                No sneezing, itching, ear ache, nasal congestion, post nasal drip,   CV:  No chest pain,  Orthopnea, PND, swelling in lower extremities, anasarca, dizziness, palpitations, syncope.   GI  No heartburn, indigestion, abdominal pain, nausea, vomiting, diarrhea, change in bowel habits, loss of appetite, bloody stools.   Resp: + shortness of breath with singing not with exertion or at rest.  No excess mucus, no productive cough,  No non-productive cough,  No coughing up of blood.  No change in color of mucus.  No wheezing.  No chest wall deformity  Skin: no rash or lesions.  GU: no dysuria, change in color of urine, no urgency or frequency.  No flank pain, no hematuria   MS:  No joint pain or swelling.  No decreased range of motion.  No back pain.  Psych:  No change in mood or affect. No depression or anxiety.  No memory loss.  Objective:   Physical Exam  BP 108/68 mmHg  Pulse 90  Ht 5\' 1"  (1.549 m)  Wt 134 lb 9.6 oz (61.054 kg)  BMI 25.45 kg/m2  SpO2 98%   Physical Exam:  General- No distress,  A&Ox3, pleasant ENT: No sinus tenderness, TM clear, pale nasal mucosa, no oral exudate,no post nasal drip, no LAN Cardiac: S1, S2, regular rate and rhythm, no murmur Chest: No wheeze/ rales/ dullness; no accessory muscle use, no nasal flaring, no sternal retractions Abd.: Soft Non-tender Ext: No clubbing cyanosis, edema Neuro:  normal strength Skin: No rashes, warm and  dry Psych: normal mood and behavior      Bevelyn NgoSarah F. Lounell Schumacher, AGACNP-BC [facility] Pager # [phone] Assessment & Plan:

## 2015-06-06 NOTE — Patient Instructions (Addendum)
It was nice to meet you today. Please have a copy of the Cardio Pulmonary Stress Test you had done in [place] sent for Dr. Sherene SiresWert to review. Continue your acid reduction medications. You can use the generic which is famotidine. We will schedule you for a methacholine asthma challenge test. Follow up appointment with Dr. Sherene SiresWert after your test is scheduled and completed. Please contact office for sooner follow up if symptoms do not improve or worsen or seek emergency care.  .Marland Kitchen

## 2015-06-06 NOTE — Assessment & Plan Note (Addendum)
Continued but somewhat improved dyspnea. Did notice improvement with Prilosec and Pepcid Also noticed improvement when at home in [place] over the month of December. Has had a Cardio-Pulmonary Stress Test in [place] 2 years ago that was self reported as normal. Plan: Please have a copy of the Cardio Pulmonary Stress Test you had done in [place] sent for Dr. Sherene SiresWert to review. Continue your acid reduction medications. You can use the generic which is famotidine. We will schedule you for a methacholine asthma challenge test. Follow up appointment with Dr. Sherene SiresWert after your test is scheduled and completed. If Asthma challenge test is normal, consider allergy testing. Please contact office for sooner follow up if symptoms do not improve or worsen or seek emergency care.

## 2015-06-13 ENCOUNTER — Encounter (HOSPITAL_COMMUNITY): Payer: BLUE CROSS/BLUE SHIELD

## 2015-07-01 ENCOUNTER — Telehealth: Payer: Self-pay | Admitting: Internal Medicine

## 2015-07-01 NOTE — Telephone Encounter (Signed)
LMOMTCB x1 for pt 

## 2015-07-02 ENCOUNTER — Ambulatory Visit: Payer: BLUE CROSS/BLUE SHIELD | Admitting: Internal Medicine

## 2015-07-02 NOTE — Telephone Encounter (Signed)
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 Please have a copy of the Cardio Pulmonary Stress Test you had done in [place] sent for Dr. Sherene SiresWert to review. Continue your acid reduction medications. You can use the generic which is famotidine. We will schedule you for a methacholine asthma challenge test. Follow up appointment with Dr. Sherene SiresWert after your test is scheduled and completed. Please contact office for sooner follow up if symptoms do not improve or worsen or seek emergency care.

## 2016-06-07 IMAGING — CR DG CHEST 2V
2 series · 2 of 2 positions shown · non-contrast
Comparison: None available

CLINICAL DATA: Acute shortness of breath for 2 months. History of
anxiety.

EXAM:
CHEST  2 VIEW

[w chest pa]
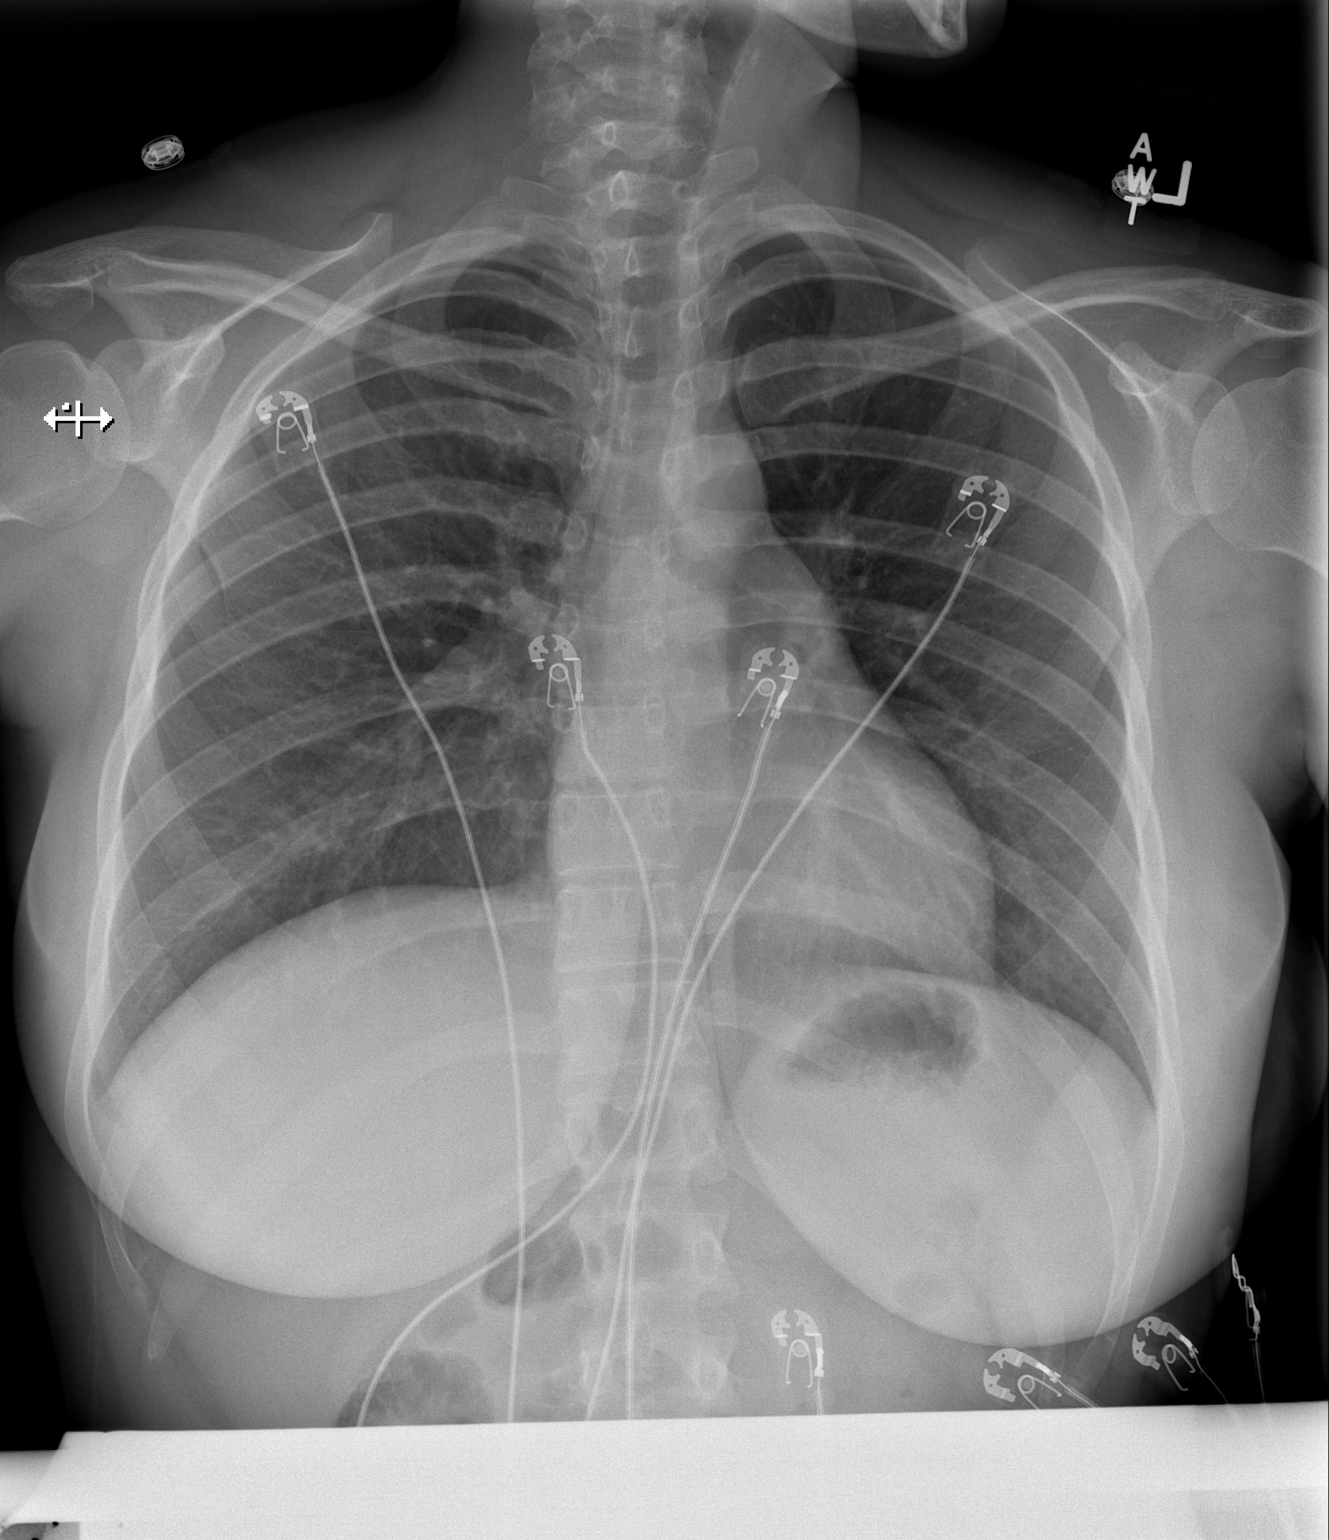

[w chest lat]
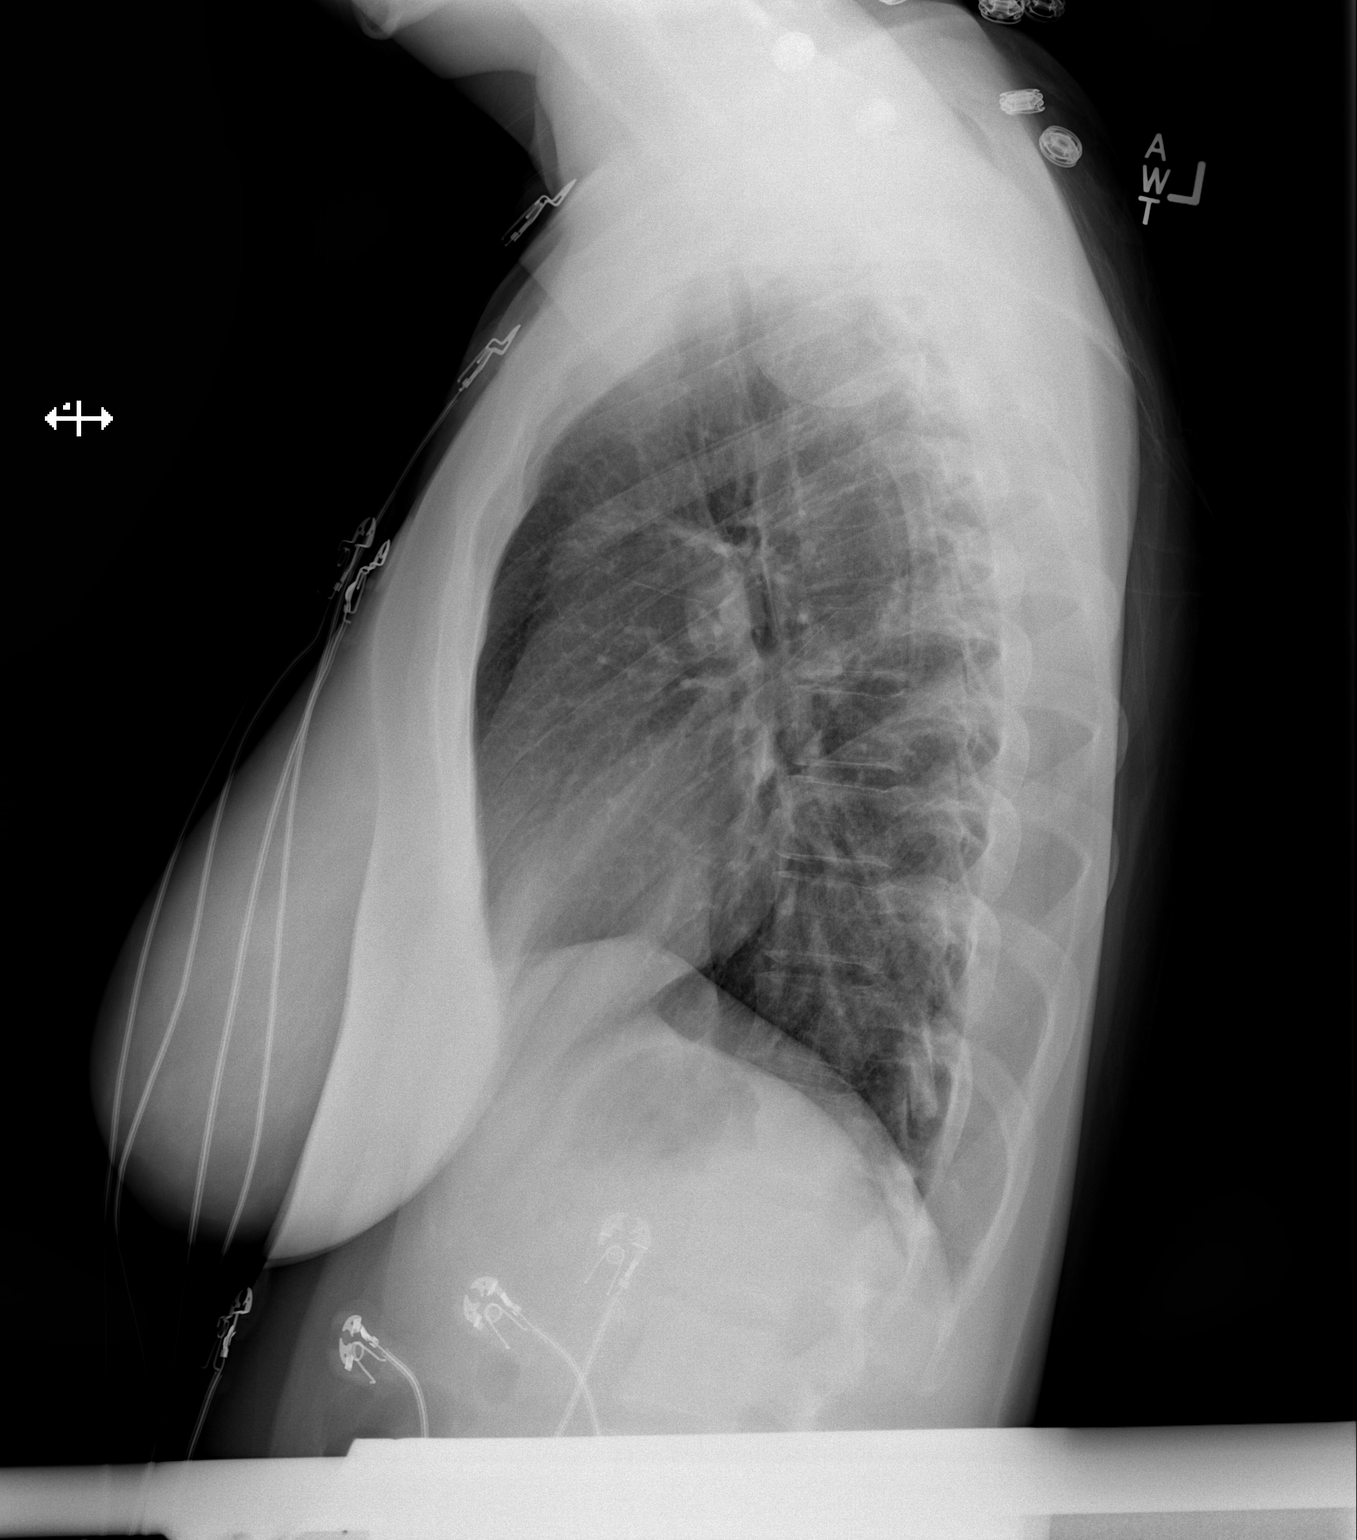

[2 of 2 positions shown; findings below may reference images not displayed]

FINDINGS: The heart size and mediastinal contours are within normal limits.
Both lungs are clear. The visualized skeletal structures are
unremarkable.
IMPRESSION: No active cardiopulmonary disease.
